# Patient Record
Sex: Male | Born: 1960 | Race: Black or African American | Hispanic: No | Marital: Single | Smoking: Current every day smoker
Health system: Southern US, Community
[De-identification: ages and names within clinical notes are randomized; demographics above are authoritative.]

## PROBLEM LIST (undated history)

## (undated) DIAGNOSIS — F32A Depression, unspecified: Secondary | ICD-10-CM

## (undated) DIAGNOSIS — I10 Essential (primary) hypertension: Secondary | ICD-10-CM

## (undated) DIAGNOSIS — F419 Anxiety disorder, unspecified: Secondary | ICD-10-CM

## (undated) DIAGNOSIS — T7840XA Allergy, unspecified, initial encounter: Secondary | ICD-10-CM

## (undated) HISTORY — DX: Allergy, unspecified, initial encounter: T78.40XA

## (undated) HISTORY — DX: Depression, unspecified: F32.A

## (undated) HISTORY — DX: Essential (primary) hypertension: I10

## (undated) HISTORY — PX: ANKLE FRACTURE SURGERY: SHX122

## (undated) HISTORY — DX: Anxiety disorder, unspecified: F41.9

---

## 2000-11-08 ENCOUNTER — Ambulatory Visit (HOSPITAL_COMMUNITY): Admission: RE | Admit: 2000-11-08 | Discharge: 2000-11-08 | Payer: Self-pay | Admitting: Family Medicine

## 2000-11-08 ENCOUNTER — Encounter: Payer: Self-pay | Admitting: Family Medicine

## 2005-02-25 ENCOUNTER — Emergency Department (HOSPITAL_COMMUNITY): Admission: EM | Admit: 2005-02-25 | Discharge: 2005-02-25 | Payer: Self-pay | Admitting: *Deleted

## 2008-01-02 ENCOUNTER — Emergency Department (HOSPITAL_COMMUNITY): Admission: EM | Admit: 2008-01-02 | Discharge: 2008-01-03 | Payer: Self-pay | Admitting: Emergency Medicine

## 2010-05-07 ENCOUNTER — Emergency Department (HOSPITAL_COMMUNITY): Admission: EM | Admit: 2010-05-07 | Discharge: 2010-05-07 | Payer: Self-pay | Admitting: Family Medicine

## 2016-09-29 ENCOUNTER — Ambulatory Visit (INDEPENDENT_AMBULATORY_CARE_PROVIDER_SITE_OTHER): Payer: BC Managed Care – PPO

## 2016-09-29 ENCOUNTER — Other Ambulatory Visit: Payer: Self-pay

## 2016-09-29 ENCOUNTER — Ambulatory Visit (INDEPENDENT_AMBULATORY_CARE_PROVIDER_SITE_OTHER): Payer: BC Managed Care – PPO | Admitting: Emergency Medicine

## 2016-09-29 VITALS — BP 130/90 | HR 65 | Temp 97.8°F | Resp 18 | Ht 67.5 in | Wt 187.8 lb

## 2016-09-29 DIAGNOSIS — R071 Chest pain on breathing: Secondary | ICD-10-CM

## 2016-09-29 LAB — POCT CBC
Granulocyte percent: 43.8 %G (ref 37–80)
HCT, POC: 42.4 % — AB (ref 43.5–53.7)
Hemoglobin: 15.4 g/dL (ref 14.1–18.1)
Lymph, poc: 3.3 (ref 0.6–3.4)
MCH, POC: 32.9 pg — AB (ref 27–31.2)
MCHC: 36.3 g/dL — AB (ref 31.8–35.4)
MCV: 90.6 fL (ref 80–97)
MID (cbc): 0.5 (ref 0–0.9)
MPV: 6.3 fL (ref 0–99.8)
POC Granulocyte: 3 (ref 2–6.9)
POC LYMPH PERCENT: 48.5 %L (ref 10–50)
POC MID %: 7.7 %M (ref 0–12)
Platelet Count, POC: 276 10*3/uL (ref 142–424)
RBC: 4.67 M/uL — AB (ref 4.69–6.13)
RDW, POC: 13.3 %
WBC: 6.8 10*3/uL (ref 4.6–10.2)

## 2016-09-29 LAB — D-DIMER, QUANTITATIVE (NOT AT ARMC): D-Dimer, Quant: 0.35 mcg/mL FEU (ref <0.50)

## 2016-09-29 LAB — TROPONIN I: Troponin I: <0.01 ng/mL (ref <=0.05)

## 2016-09-29 MED ORDER — CYCLOBENZAPRINE HCL 5 MG PO TABS: 5.0000 mg | ORAL_TABLET | Freq: Three times a day (TID) | ORAL | 1 refills | Status: AC | PRN

## 2016-09-29 NOTE — Progress Notes (Signed)
Patient ID: Clarence Smith, male   DOB: Sep 13, 1961, 55 y.o.   MRN: [id number]    By signing my name below, I, Essence Howell, attest that this documentation has been prepared under the direction and in the presence of Darlyne Russian, MD Electronically Signed: Ladene Artist, ED Scribe 09/29/2016 at 12:43 PM.  Chief Complaint:  Chief Complaint  Patient presents with  . Chest Pain    upper chest pain - more severe in the last 3 days  . Shortness of Breath    HPI: Clarence Smith is a 55 y.o. male who reports to [facility] today complaining of intermittent central chest pain, constant right upper chest pain over the past 4 days. Pt describes pain as gradually worsening, dull pain that progresses as the day goes on, with exertion, palpation and with laughing. Pt states "it feels as if something is pushing outwards in my chest". Pt works as a Librarian, academic at [employer] and reports some lifting but denies heavy lifting or known injury. He reports associated symptoms of sob that is exacerbated with simple acts such as taking his clothes off, numbness in right arm, right neck stiffness and a globus sensation. He also reports mild right calf pain but denies calf swelling. Pt denies nausea and diaphoresis. He is a current smoker; reports that it takes him 2.5 weeks to complete 1 pack of cigarettes. He reports a strong family h/o DM, aunt who passed from MI, sister with a h/o leukemia that is in remission. Pt denies a personal h/o heart or lung disease. Pt denies recent travel.   No past medical history on file. No past surgical history on file. Social History   Social History  . Marital status: Single    Spouse name: N/A  . Number of children: N/A  . Years of education: N/A   Social History Main Topics  . Smoking status: Current Every Day Smoker  . Smokeless tobacco: None  . Alcohol use None  . Drug use: Unknown  . Sexual activity: Not Asked   Other Topics Concern  . None   Social History Narrative  . None   Family  History  Problem Relation Age of Onset  . Cancer Mother   . Hypertension Mother   . Diabetes Father   . Alcohol abuse Father    No Known Allergies Prior to Admission medications   Medication Sig Start Date End Date Taking? Authorizing Provider  aspirin EC 81 MG tablet Take 81 mg by mouth daily.   Yes Historical Provider, MD   ROS: The patient denies fevers, chills, night sweats, unintentional weight loss, palpitations, wheezing, nausea, vomiting, abdominal pain, dysuria, hematuria, melena, weakness, or tingling. +chest pain, +sob, +neck stiffness, +numbness  All other systems have been reviewed and were otherwise negative with the exception of those mentioned in the HPI and as above.    PHYSICAL EXAM: Vitals:   09/29/16 1235  BP: 130/90  Pulse: 65  Resp: 18  Temp: 97.8 F (36.6 C)   Body mass index is 28.98 kg/m.   General: Alert, no acute distress HEENT:  Normocephalic, atraumatic, oropharynx patent. Eye: [facility] Cardiovascular:  Regular rate and rhythm, no rubs murmurs or gallops.  No Carotid bruits, radial pulse intact. No pedal edema.  Respiratory: Clear to auscultation bilaterally.  No wheezes, rales, or rhonchi.  No cyanosis, no use of accessory musculature. Minimal diminished breath sounds on the R.  Abdominal: No organomegaly, abdomen is soft and non-tender, positive bowel sounds.  No masses. Musculoskeletal:  Gait intact. No edema, tenderness. Complaining of R-sided chest fullness. No swelling or calf tendernessHe does have significant tenderness over the right periscapular area.. Skin: No rashes on R upper chest.  Neurologic: Facial musculature symmetric. Psychiatric: Patient acts appropriately throughout our interaction. Lymphatic: No cervical or submandibular lymphadenopathy  LABS: Results for orders placed or performed in visit on 09/29/16  POCT CBC  Result Value Ref Range   WBC 6.8 4.6 - 10.2 K/uL   Lymph, poc 3.3 0.6 - 3.4   POC LYMPH PERCENT 48.5 10  - 50 %L   MID (cbc) 0.5 0 - 0.9   POC MID % 7.7 0 - 12 %M   POC Granulocyte 3.0 2 - 6.9   Granulocyte percent 43.8 37 - 80 %G   RBC 4.67 (A) 4.69 - 6.13 M/uL   Hemoglobin 15.4 14.1 - 18.1 g/dL   HCT, POC 42.4 (A) 43.5 - 53.7 %   MCV 90.6 80 - 97 fL   MCH, POC 32.9 (A) 27 - 31.2 pg   MCHC 36.3 (A) 31.8 - 35.4 g/dL   RDW, POC 13.3 %   Platelet Count, POC 276 142 - 424 K/uL   MPV 6.3 0 - 99.8 fL   EKG/XRAY:   Primary read interpreted by Dr. Everlene Farrier at [facility]. EKG shows left axis deviation left anterior hemiblock no acute changes. T-wave inverted in lead 3 Dg Chest 2 View  Result Date: 09/29/2016 CLINICAL DATA:  55 year old with chest pain. EXAM: CHEST  2 VIEW COMPARISON:  None. FINDINGS: Heart and mediastinum are within normal limits. Both lungs are clear without airspace disease or pulmonary edema. No acute bone abnormality. No pleural effusions. Negative for a pneumothorax. IMPRESSION: No active cardiopulmonary disease. Electronically Signed   By: Markus Daft M.D.   On: 09/29/2016 13:28   ASSESSMENT/PLAN: Patient presents with progressive discomfort right upper anterior chest and also tenderness in the right posterior cervical periscapular area. Will treat with a muscle relaxant and Tylenol for now. He can apply heat or ice to the area. I have taken the patient out of work a couple of days. Recheck in 72 hours if not improved. A stat troponin and d-dimer were done.I personally performed the services described in this documentation, which was scribed in my presence. The recorded information has been reviewed and is accurate. Pulse ox stayed 96-98 with ambulation.    Gross sideeffects, risk and benefits, and alternatives of medications d/w patient. Patient is aware that all medications have potential sideeffects and we are unable to predict every sideeffect or drug-drug interaction that may occur.  Arlyss Queen MD 09/29/2016 12:43 PM

## 2016-09-29 NOTE — Patient Instructions (Addendum)
Take 2 Tylenol every 8 hours as needed for pain. Take your muscle relaxant every 6-8 hours. Try and apply heat or ice to the area of discomfort.    IF you received an x-ray today, you will receive an invoice from [facility]. Please contact [facility] at [phone] with questions or concerns regarding your invoice.   IF you received labwork today, you will receive an invoice from [employer]. Please contact [facility] at [phone] with questions or concerns regarding your invoice.   Our billing staff will not be able to assist you with questions regarding bills from these companies.  You will be contacted with the lab results as soon as they are available. The fastest way to get your results is to activate your My Chart account. Instructions are located on the last page of this paperwork. If you have not heard from [place] regarding the results in 2 weeks, please contact this office.    Chest Wall Pain Chest wall pain is pain in or around the bones and muscles of your chest. Sometimes, an injury causes this pain. Sometimes, the cause may not be known. This pain may take several weeks or longer to get better. HOME CARE INSTRUCTIONS  Pay attention to any changes in your symptoms. Take these actions to help with your pain:   Rest as told by your health care provider.   Avoid activities that cause pain. These include any activities that use your chest muscles or your abdominal and side muscles to lift heavy items.   If directed, apply ice to the painful area:  Put ice in a plastic bag.  Place a towel between your skin and the bag.  Leave the ice on for 20 minutes, 2-3 times per day.  Take over-the-counter and prescription medicines only as told by your health care provider.  Do not use tobacco products, including cigarettes, chewing tobacco, and e-cigarettes. If you need help quitting, ask your health care provider.  Keep all follow-up  visits as told by your health care provider. This is important. SEEK MEDICAL CARE IF:  You have a fever.  Your chest pain becomes worse.  You have new symptoms. SEEK IMMEDIATE MEDICAL CARE IF:  You have nausea or vomiting.  You feel sweaty or light-headed.  You have a cough with phlegm (sputum) or you cough up blood.  You develop shortness of breath.   This information is not intended to replace advice given to you by your health care provider. Make sure you discuss any questions you have with your health care provider.   Document Released: 11/08/2005 Document Revised: 07/30/2015 Document Reviewed: 02/03/2015 Elsevier Interactive Patient Education [employer].

## 2016-09-29 NOTE — Progress Notes (Addendum)
[facility] CALLED D DIMER 0.35 MICHAEL CLARK PA ADVISED AND PATIENT ADVISED NEGATIVE/NO BLOOD CLOT

## 2017-11-04 IMAGING — DX DG CHEST 2V
2 series · 2 of 2 positions shown · non-contrast
Comparison: None.

CLINICAL DATA: 55-year-old with chest pain.

EXAM:
CHEST  2 VIEW

[chest pa]
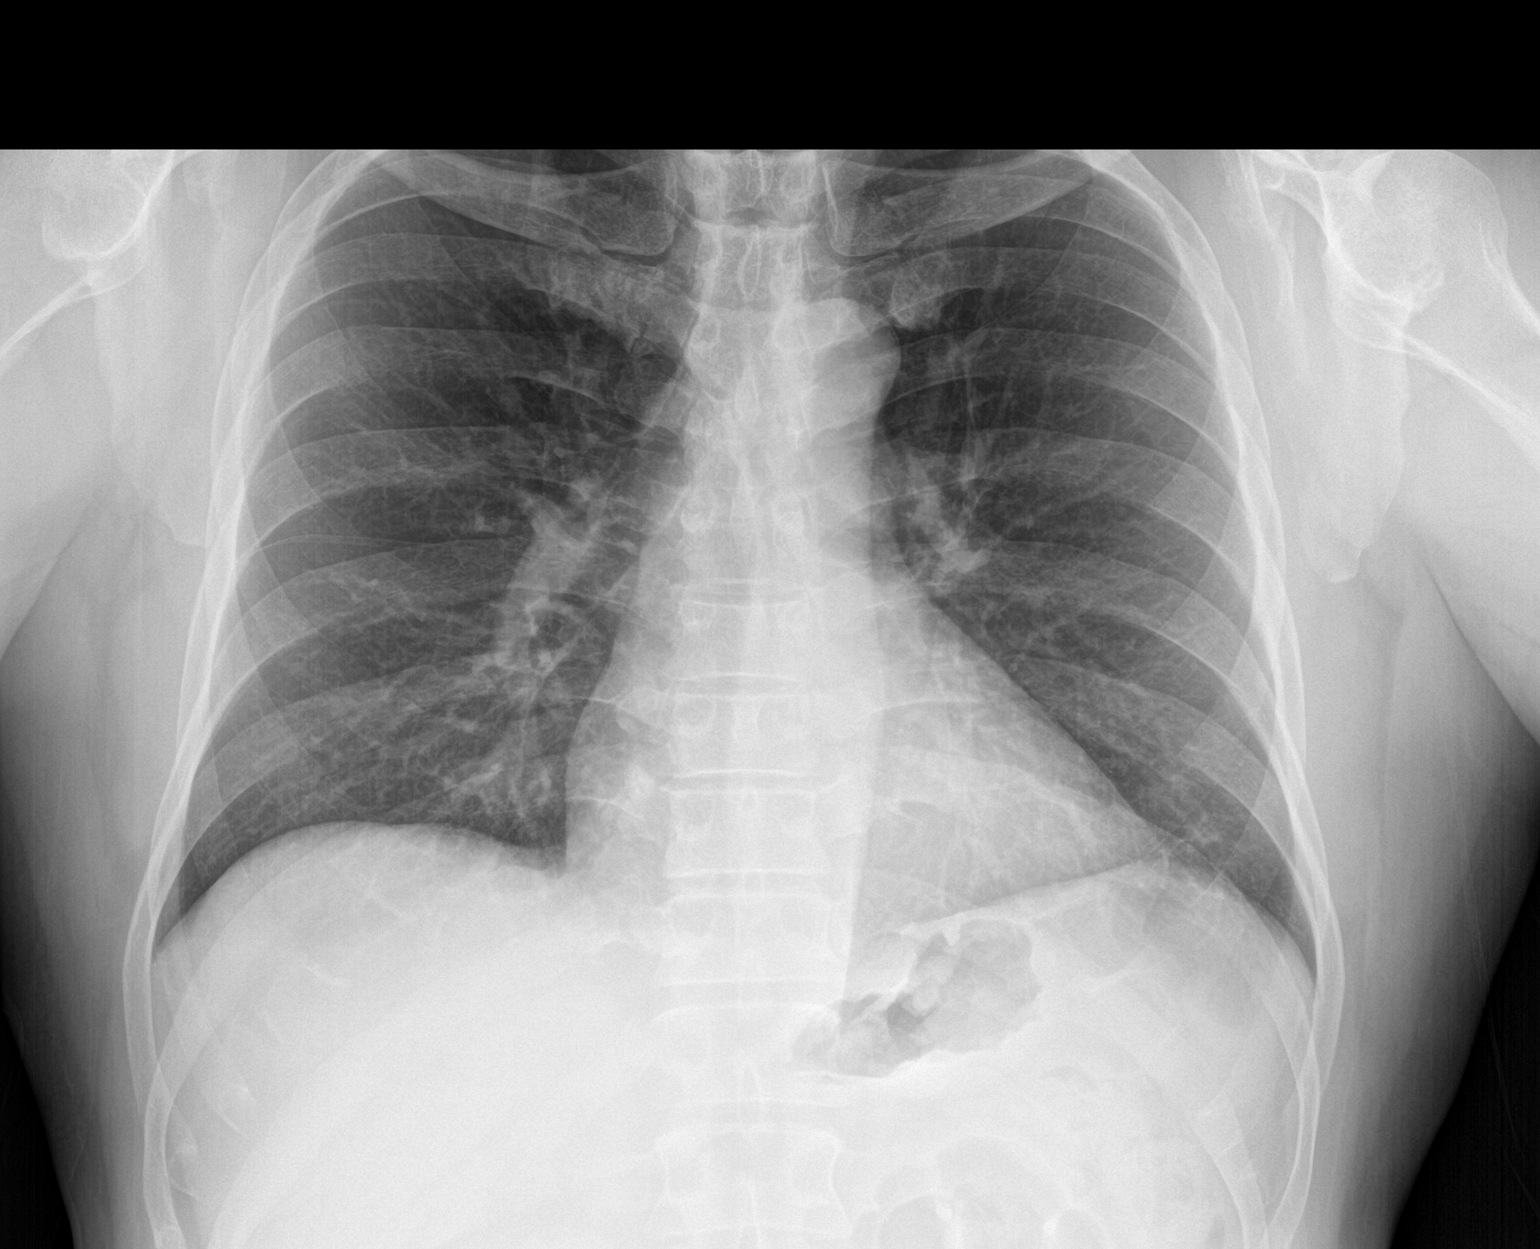

[chest lat]
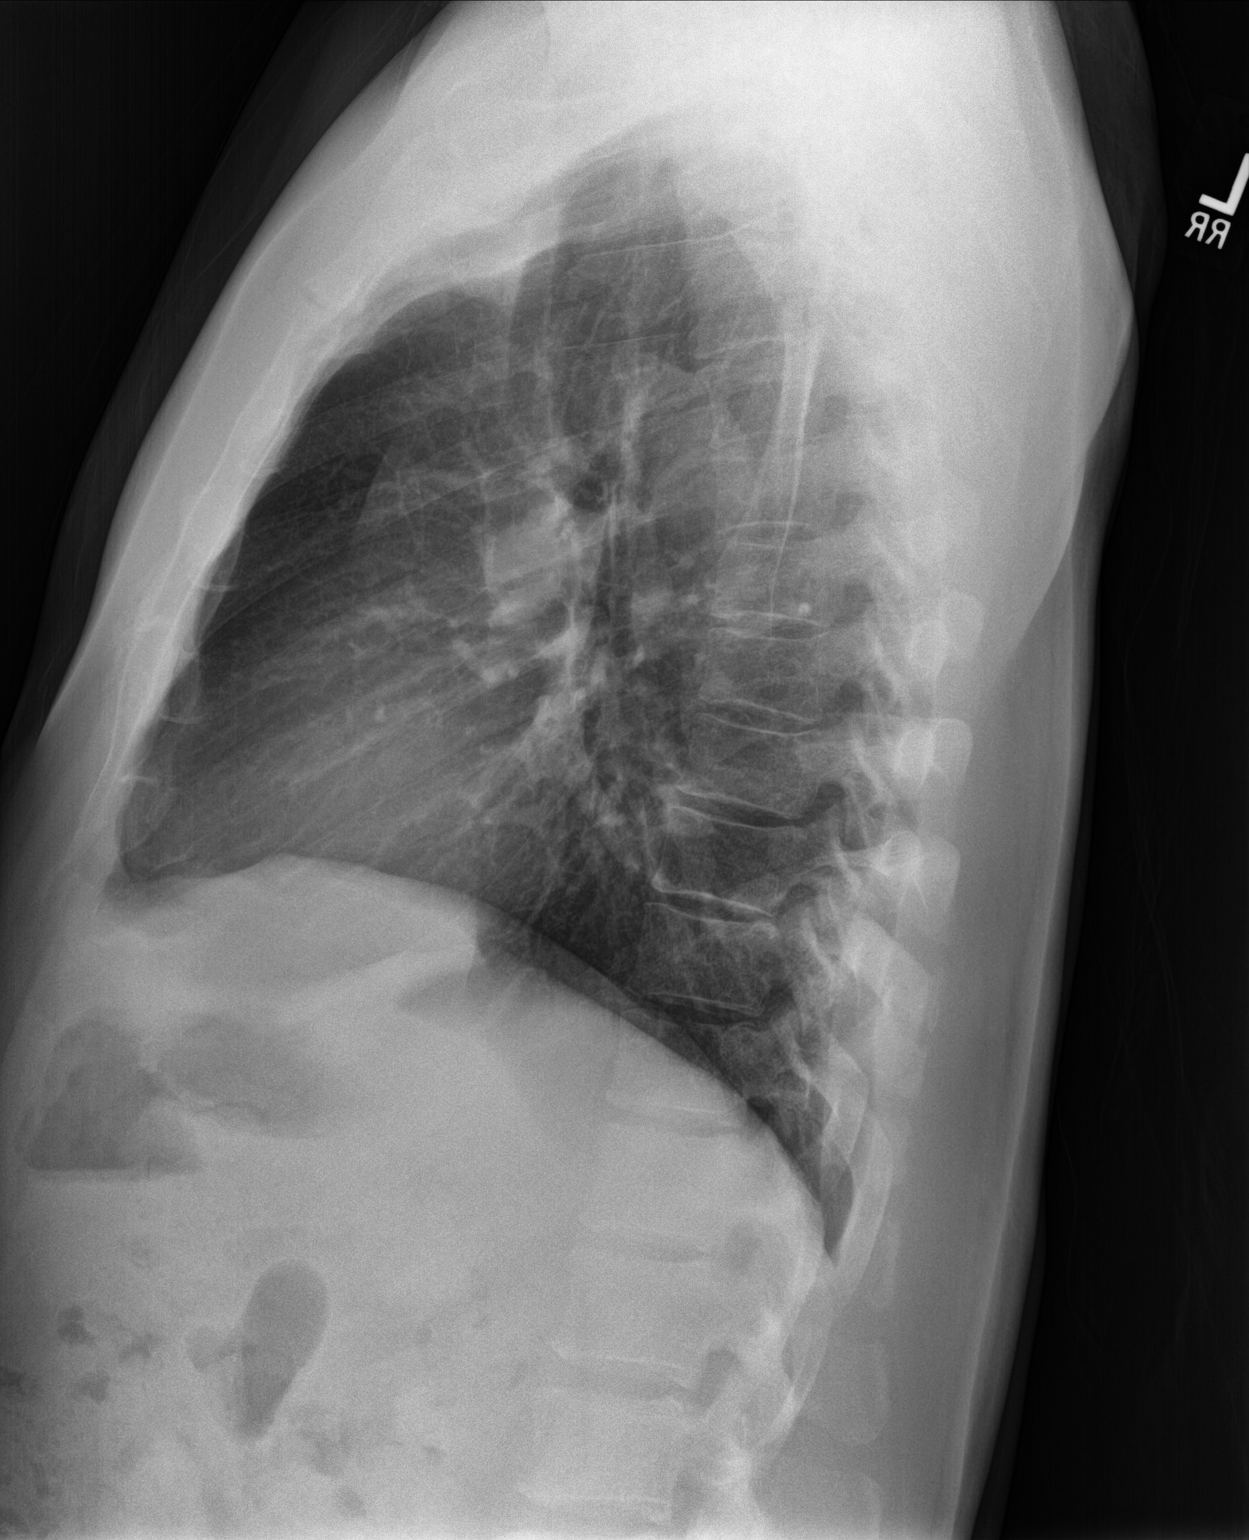

[2 of 2 positions shown; findings below may reference images not displayed]

FINDINGS: Heart and mediastinum are within normal limits. Both lungs are clear
without airspace disease or pulmonary edema. No acute bone
abnormality. No pleural effusions. Negative for a pneumothorax.
IMPRESSION: No active cardiopulmonary disease.

## 2019-07-16 ENCOUNTER — Other Ambulatory Visit: Payer: Self-pay

## 2019-07-16 DIAGNOSIS — Z20822 Contact with and (suspected) exposure to covid-19: Secondary | ICD-10-CM

## 2019-07-17 LAB — NOVEL CORONAVIRUS, NAA: SARS-CoV-2, NAA: NOT DETECTED

## 2019-11-21 ENCOUNTER — Ambulatory Visit: Payer: BC Managed Care – PPO | Attending: Internal Medicine

## 2019-11-21 DIAGNOSIS — Z20822 Contact with and (suspected) exposure to covid-19: Secondary | ICD-10-CM

## 2019-11-22 LAB — NOVEL CORONAVIRUS, NAA: SARS-CoV-2, NAA: NOT DETECTED

## 2020-12-11 ENCOUNTER — Other Ambulatory Visit: Payer: Self-pay

## 2020-12-11 DIAGNOSIS — Z20822 Contact with and (suspected) exposure to covid-19: Secondary | ICD-10-CM

## 2020-12-13 LAB — NOVEL CORONAVIRUS, NAA: SARS-CoV-2, NAA: DETECTED — AB

## 2020-12-13 LAB — SARS-COV-2, NAA 2 DAY TAT

## 2020-12-30 ENCOUNTER — Ambulatory Visit (AMBULATORY_SURGERY_CENTER): Payer: Self-pay

## 2020-12-30 ENCOUNTER — Other Ambulatory Visit: Payer: Self-pay

## 2020-12-30 VITALS — Ht 67.5 in | Wt 196.0 lb

## 2020-12-30 DIAGNOSIS — Z1211 Encounter for screening for malignant neoplasm of colon: Secondary | ICD-10-CM

## 2020-12-30 MED ORDER — SUTAB 1479-225-188 MG PO TABS: 1.0000 | ORAL_TABLET | ORAL | 0 refills | Status: DC

## 2020-12-30 NOTE — Progress Notes (Signed)
No egg or soy allergy known to patient  No issues with past sedation with any surgeries or procedures No intubation problems in the past  No FH of Malignant Hyperthermia No diet pills per patient No home 02 use per patient  No blood thinners per patient  Pt denies issues with constipation  No A fib or A flutter  EMMI video via MyChart  COVID 19 guidelines implemented in [facility] today with Pt and RN  Pt is fully vaccinated for Covid x 2 + booster= Coupon given to pt in [facility] today , Code to Pharmacy and  NO PA's for preps discussed with pt in [facility] today  Due to the COVID-19 pandemic we are asking patients to follow certain guidelines.  Pt aware of COVID protocols and [facility] guidelines

## 2021-01-09 ENCOUNTER — Encounter: Payer: Self-pay | Admitting: Internal Medicine

## 2021-01-13 ENCOUNTER — Other Ambulatory Visit: Payer: Self-pay

## 2021-01-13 ENCOUNTER — Ambulatory Visit (AMBULATORY_SURGERY_CENTER): Payer: BC Managed Care – PPO | Admitting: Internal Medicine

## 2021-01-13 ENCOUNTER — Encounter: Payer: Self-pay | Admitting: Internal Medicine

## 2021-01-13 VITALS — BP 122/86 | HR 70 | Temp 97.1°F | Resp 18 | Ht 67.5 in | Wt 196.0 lb

## 2021-01-13 DIAGNOSIS — D123 Benign neoplasm of transverse colon: Secondary | ICD-10-CM

## 2021-01-13 DIAGNOSIS — K635 Polyp of colon: Secondary | ICD-10-CM

## 2021-01-13 DIAGNOSIS — Z800 Family history of malignant neoplasm of digestive organs: Secondary | ICD-10-CM | POA: Diagnosis not present

## 2021-01-13 DIAGNOSIS — Z1211 Encounter for screening for malignant neoplasm of colon: Secondary | ICD-10-CM

## 2021-01-13 MED ORDER — SODIUM CHLORIDE 0.9 % IV SOLN: 500.0000 mL | Freq: Once | INTRAVENOUS | Status: AC

## 2021-01-13 NOTE — Progress Notes (Signed)
Vitals-SDH  Pt's states no medical or surgical changes since previsit or office visit.  Patient has two loose teeth in front! CRNA aware

## 2021-01-13 NOTE — Op Note (Signed)
[facility] Patient Name: Clarence Smith Procedure Date: 01/13/2021 11:05 AM MRN: [id number] Endoscopist: Jerene Bears , MD Age: 60 Referring MD:  Date of Birth: March 11, 1961 Gender: Male Account #: [id number] Procedure:                Colonoscopy Indications:              Screening in patient at increased risk: Family                            history of 1st-degree relative (mother) with                            colorectal cancer, This is the patient's first                            colonoscopy Medicines:                Monitored Anesthesia Care Procedure:                Pre-Anesthesia Assessment:                           - Prior to the procedure, a History and Physical                            was performed, and patient medications and                            allergies were reviewed. The patient's tolerance of                            previous anesthesia was also reviewed. The risks                            and benefits of the procedure and the sedation                            options and risks were discussed with the patient.                            All questions were answered, and informed consent                            was obtained. Prior Anticoagulants: The patient has                            taken no previous anticoagulant or antiplatelet                            agents. ASA Grade Assessment: II - A patient with                            mild systemic disease. After reviewing the risks  and benefits, the patient was deemed in                            satisfactory condition to undergo the procedure.                           After obtaining informed consent, the colonoscope                            was passed under direct vision. Throughout the                            procedure, the patient's blood pressure, pulse, and                            oxygen saturations were monitored continuously. The                             Colonoscope was introduced through the anus and                            advanced to the cecum, identified by appendiceal                            orifice and ileocecal valve. The colonoscopy was                            performed without difficulty. The patient tolerated                            the procedure well. The quality of the bowel                            preparation was good. The ileocecal valve,                            appendiceal orifice, and rectum were photographed. Scope In: 11:28:52 AM Scope Out: 11:42:57 AM Scope Withdrawal Time: 0 hours 11 minutes 57 seconds  Total Procedure Duration: 0 hours 14 minutes 5 seconds  Findings:                 The digital rectal exam was normal.                           A 8 mm polyp was found in the hepatic flexure. The                            polyp was sessile. The polyp was removed with a                            cold snare. Resection and retrieval were complete.                           A 4 mm polyp was found  in the transverse colon. The                            polyp was sessile. The polyp was removed with a                            cold snare. Resection and retrieval were complete.                           Multiple small and large-mouthed diverticula were                            found in the descending colon and splenic flexure.                           The retroflexed view of the distal rectum and anal                            verge was normal and showed no anal or rectal                            abnormalities. Complications:            No immediate complications. Estimated Blood Loss:     Estimated blood loss was minimal. Impression:               - One 8 mm polyp at the hepatic flexure, removed                            with a cold snare. Resected and retrieved.                           - One 4 mm polyp in the transverse colon, removed                            with a cold snare.  Resected and retrieved.                           - Diverticulosis in the descending colon and at the                            splenic flexure. Recommendation:           - Patient has a contact number available for                            emergencies. The signs and symptoms of potential                            delayed complications were discussed with the                            patient. Return to normal activities tomorrow.  Written discharge instructions were provided to the                            patient.                           - Resume previous diet.                           - Continue present medications.                           - Await pathology results.                           - Repeat colonoscopy is recommended. The                            colonoscopy date will be determined after pathology                            results from today's exam become available for                            review. Jerene Bears, MD 01/13/2021 11:56:38 AM This report has been signed electronically.

## 2021-01-13 NOTE — Discharge Instructions (Signed)
Resume previous medications. Handouts on findings given to patient. Await pathology for final recommendations.  YOU HAD AN ENDOSCOPIC PROCEDURE TODAY AT THE [facility]:   Refer to the procedure report that was given to you for any specific questions about what was found during the examination.  If the procedure report does not answer your questions, please call your gastroenterologist to clarify.  If you requested that your care partner not be given the details of your procedure findings, then the procedure report has been included in a sealed envelope for you to review at your convenience later.  YOU SHOULD EXPECT: Some feelings of bloating in the abdomen. Passage of more gas than usual.  Walking can help get rid of the air that was put into your GI tract during the procedure and reduce the bloating. If you had a lower endoscopy (such as a colonoscopy or flexible sigmoidoscopy) you may notice spotting of blood in your stool or on the toilet paper. If you underwent a bowel prep for your procedure, you may not have a normal bowel movement for a few days.  Please Note:  You might notice some irritation and congestion in your nose or some drainage.  This is from the oxygen used during your procedure.  There is no need for concern and it should clear up in a day or so.  SYMPTOMS TO REPORT IMMEDIATELY:   Following lower endoscopy (colonoscopy or flexible sigmoidoscopy):  Excessive amounts of blood in the stool  Significant tenderness or worsening of abdominal pains  Swelling of the abdomen that is new, acute  Fever of 100F or higher  For urgent or emergent issues, a gastroenterologist can be reached at any hour by calling [phone]. Do not use MyChart messaging for urgent concerns.    DIET:  We do recommend a small meal at first, but then you may proceed to your regular diet.  Drink plenty of fluids but you should avoid alcoholic beverages for 24 hours.  ACTIVITY:  You should  plan to take it easy for the rest of today and you should NOT DRIVE or use heavy machinery until tomorrow (because of the sedation medicines used during the test).    FOLLOW UP: Our staff will call the number listed on your records 48-72 hours following your procedure to check on you and address any questions or concerns that you may have regarding the information given to you following your procedure. If we do not reach you, we will leave a message.  We will attempt to reach you two times.  During this call, we will ask if you have developed any symptoms of COVID 19. If you develop any symptoms (ie: fever, flu-like symptoms, shortness of breath, cough etc.) before then, please call [phone].  If you test positive for Covid 19 in the 2 weeks post procedure, please call and report this information to us.    If any biopsies were taken you will be contacted by phone or by letter within the next 1-3 weeks.  Please call us at [phone] if you have not heard about the biopsies in 3 weeks.    SIGNATURES/CONFIDENTIALITY: You and/or your care partner have signed paperwork which will be entered into your electronic medical record.  These signatures attest to the fact that that the information above on your After Visit Summary has been reviewed and is understood.  Full responsibility of the confidentiality of this discharge information lies with you and/or your care-partner. 

## 2021-01-13 NOTE — Progress Notes (Signed)
Called to room to assist during endoscopic procedure.  Patient ID and intended procedure confirmed with present staff. Received instructions for my participation in the procedure from the performing physician.  

## 2021-01-13 NOTE — Progress Notes (Signed)
Patient ID: Clarence Smith, male   DOB: 01-Jun-1961, 60 y.o.   MRN: [id number] Pt tolerated procedure well. Vitals stable. AVS provided

## 2021-01-15 ENCOUNTER — Telehealth: Payer: Self-pay

## 2021-01-15 NOTE — Telephone Encounter (Signed)
  Follow up Call-  Call back number 01/13/2021  Post procedure Call Back phone  # [phone]  Permission to leave phone message Yes  Some recent data might be hidden     Patient questions:  Do you have a fever, pain , or abdominal swelling? No. Pain Score  0 *  Have you tolerated food without any problems? Yes.    Have you been able to return to your normal activities? Yes.    Do you have any questions about your discharge instructions: Diet   No. Medications  No. Follow up visit  No.  Do you have questions or concerns about your Care? No.  Actions: * If pain score is 4 or above: No action needed, pain <4.  1. Have you developed a fever since your procedure? No   2.   Have you had an respiratory symptoms (SOB or cough) since your procedure? No   3.   Have you tested positive for COVID 19 since your procedure? No   4.   Have you had any family members/close contacts diagnosed with the COVID 19 since your procedure?  No    If yes to any of these questions please route to Joylene John, RN and Joella Prince, RN

## 2021-01-21 ENCOUNTER — Encounter: Payer: Self-pay | Admitting: Internal Medicine

## 2022-02-02 ENCOUNTER — Other Ambulatory Visit: Payer: Self-pay | Admitting: Family Medicine

## 2022-02-02 DIAGNOSIS — E782 Mixed hyperlipidemia: Secondary | ICD-10-CM

## 2022-03-08 ENCOUNTER — Ambulatory Visit
Admission: RE | Admit: 2022-03-08 | Discharge: 2022-03-08 | Disposition: A | Discharge status: Home / Self Care | Payer: No Typology Code available for payment source | Source: Ambulatory Visit | Attending: Family Medicine | Admitting: Family Medicine

## 2022-03-08 DIAGNOSIS — E782 Mixed hyperlipidemia: Secondary | ICD-10-CM

## 2022-03-23 ENCOUNTER — Other Ambulatory Visit: Payer: Self-pay | Admitting: Family Medicine

## 2022-03-23 DIAGNOSIS — R9389 Abnormal findings on diagnostic imaging of other specified body structures: Secondary | ICD-10-CM

## 2022-03-24 ENCOUNTER — Ambulatory Visit
Admission: RE | Admit: 2022-03-24 | Discharge: 2022-03-24 | Disposition: A | Discharge status: Home / Self Care | Payer: BC Managed Care – PPO | Source: Ambulatory Visit | Attending: Family Medicine | Admitting: Family Medicine

## 2022-03-24 DIAGNOSIS — R9389 Abnormal findings on diagnostic imaging of other specified body structures: Secondary | ICD-10-CM

## 2022-03-24 MED ORDER — IOPAMIDOL (ISOVUE-300) INJECTION 61%
75.0000 mL | Freq: Once | INTRAVENOUS | Status: AC | PRN
  Administered 2022-03-24: 75 mL via INTRAVENOUS

## 2023-04-13 IMAGING — CT CT CARDIAC CORONARY ARTERY CALCIUM SCORE
3 series · 13 of 20 positions shown, 15 images · non-contrast
Comparison: None.

CLINICAL DATA: 61-year-old black male with mixed hyperlipidemia.

EXAM:
CT CARDIAC CORONARY ARTERY CALCIUM SCORE
TECHNIQUE: Non-contrast imaging through the heart was performed using
prospective ECG gating. Image post processing was performed on an
independent workstation, allowing for quantitative analysis of the
heart and coronary arteries. Note that this exam targets the heart
and the chest was not imaged in its entirety.

[Series 2: calcium scoring 2.00 qr36 bestdiast 70% hrt calciu · axial · 0.45mm/px · z∈[+1493,+1541]mm · 3 of 60 slices shown]
[im 12/60  vessel]
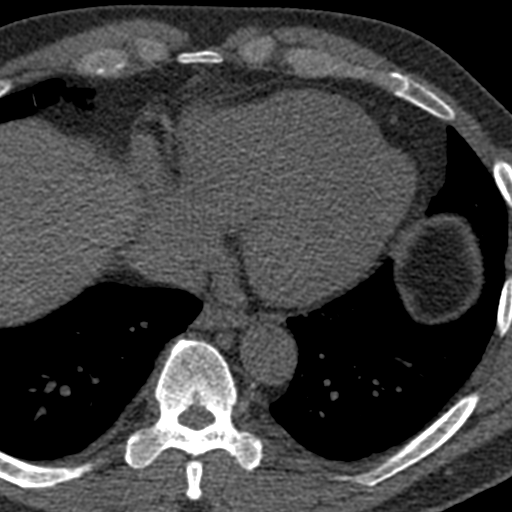
[im 24/60  vessel]
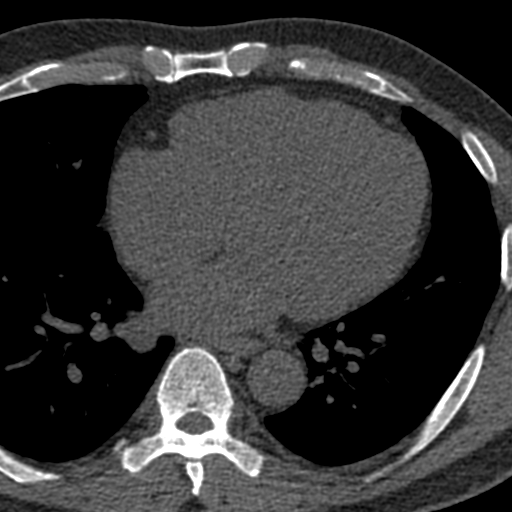
[im 36/60  vessel]
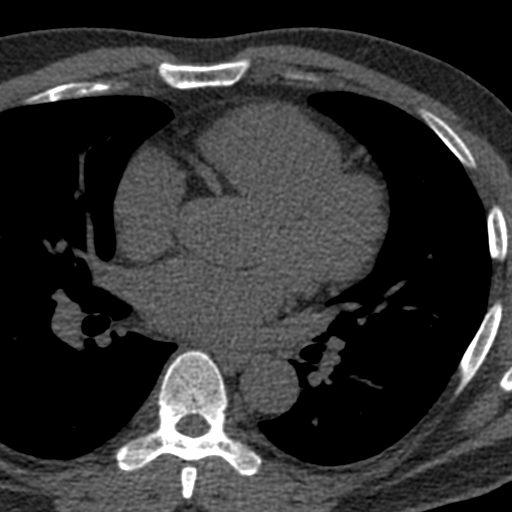

[Series 3: calcium scoring 2.00 br40 bestdiast 70% axial · axial · 0.56mm/px · z∈[+1489,+1569]mm · 5 of 60 slices shown, 7 images]
[im 10/60  vessel]
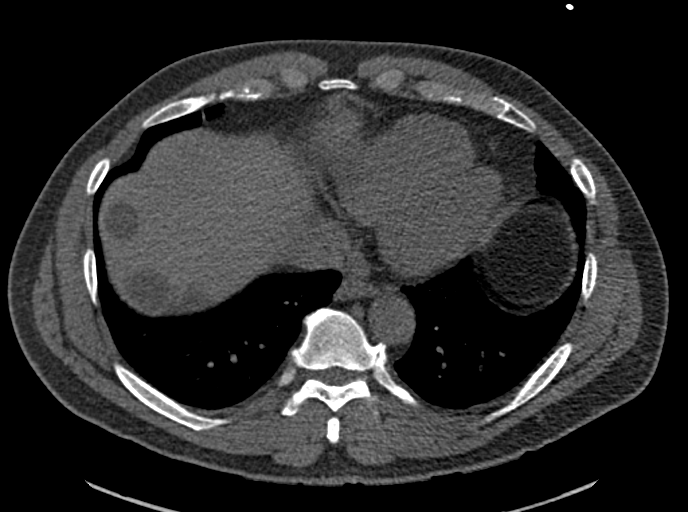
[im 10/60  lung]
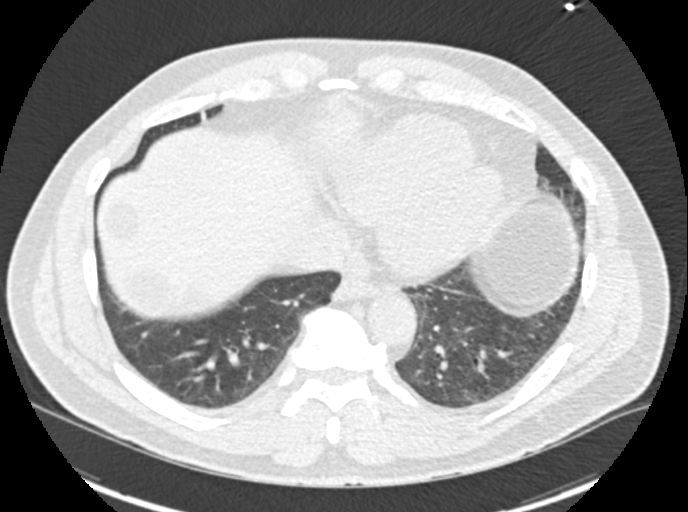
[im 20/60  vessel]
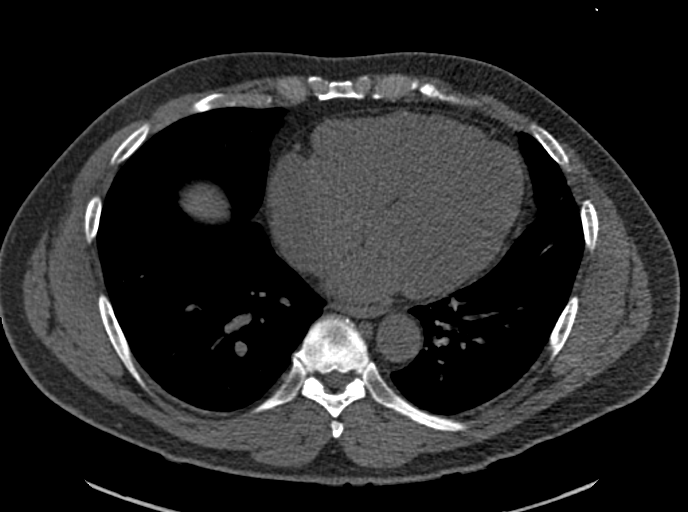
[im 30/60  vessel]
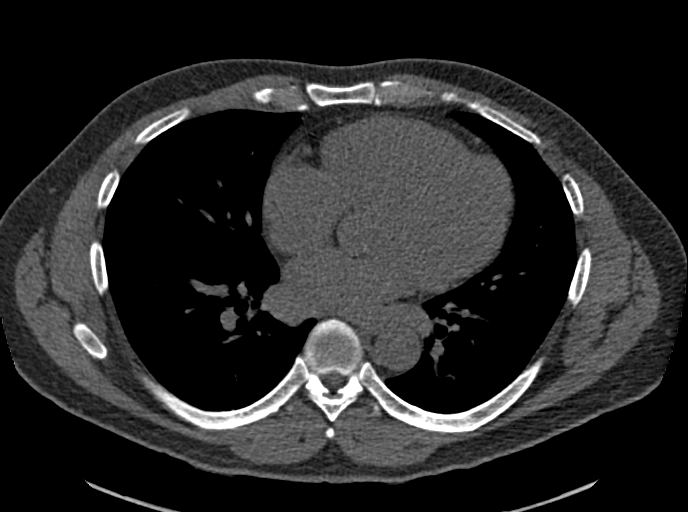
[im 40/60  vessel]
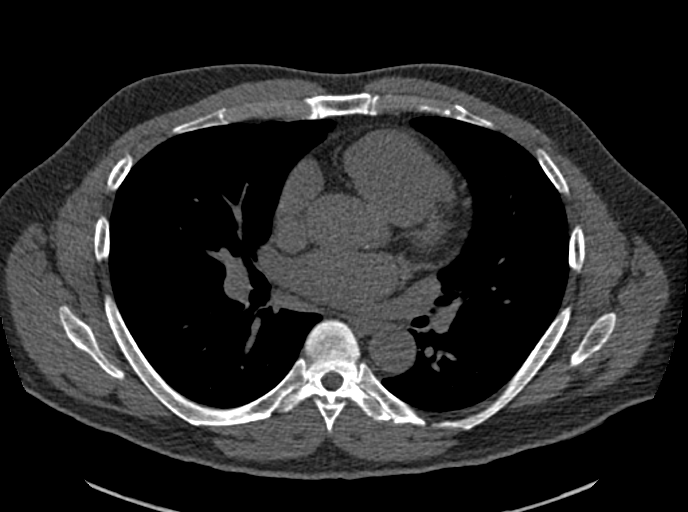
[im 50/60  vessel]
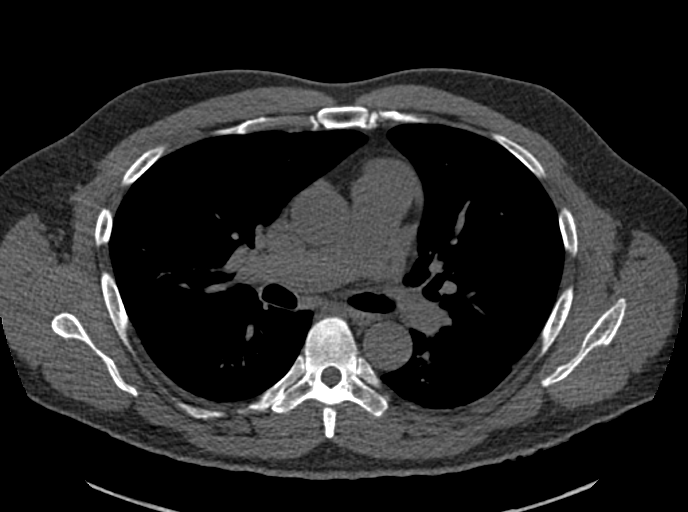
[im 50/60  lung]
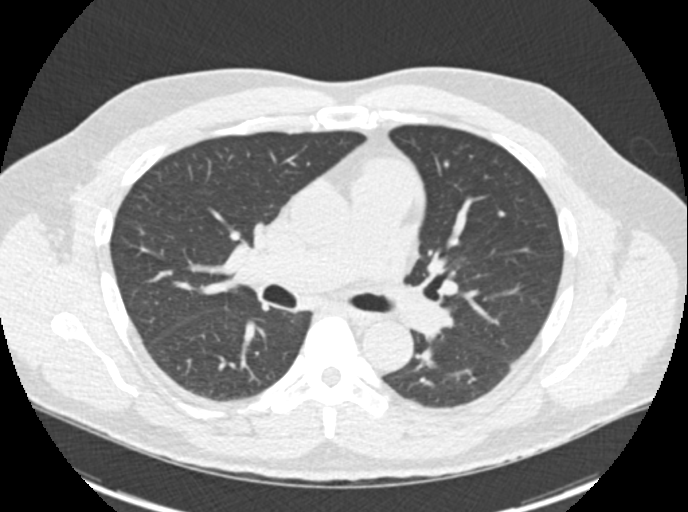

[Series 9: calcium scoring 2.00 br60 bestdiast 70% lungs · axial · 0.56mm/px · z∈[+1489,+1569]mm · 5 of 60 slices shown]
[im 10/60  vessel]
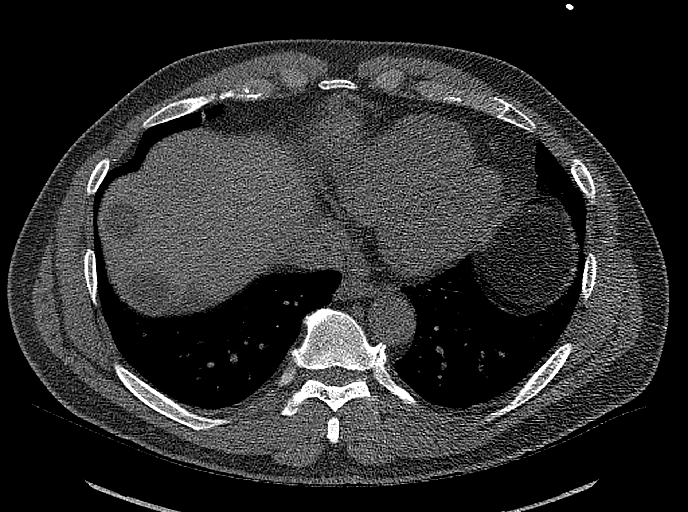
[im 20/60  vessel]
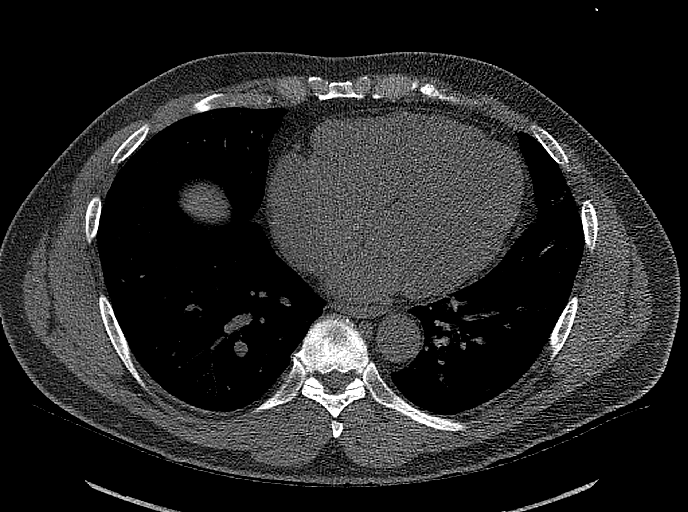
[im 30/60  vessel]
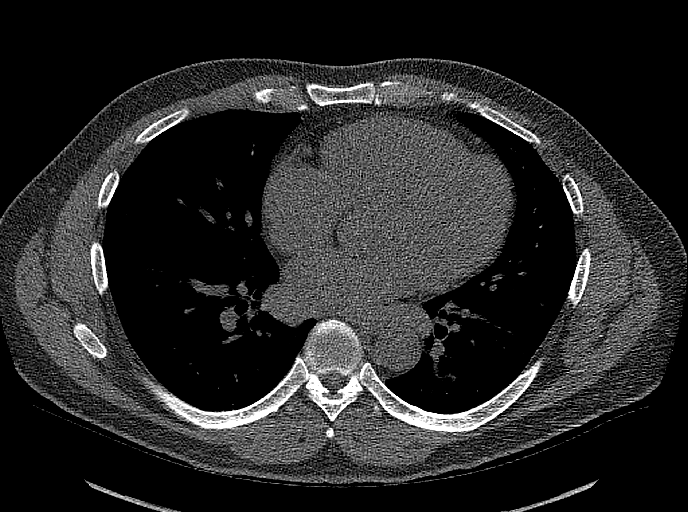
[im 40/60  vessel]
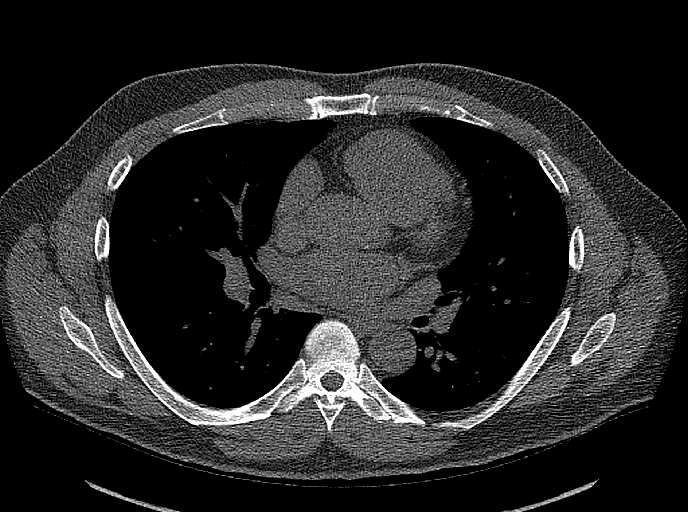
[im 50/60  vessel]
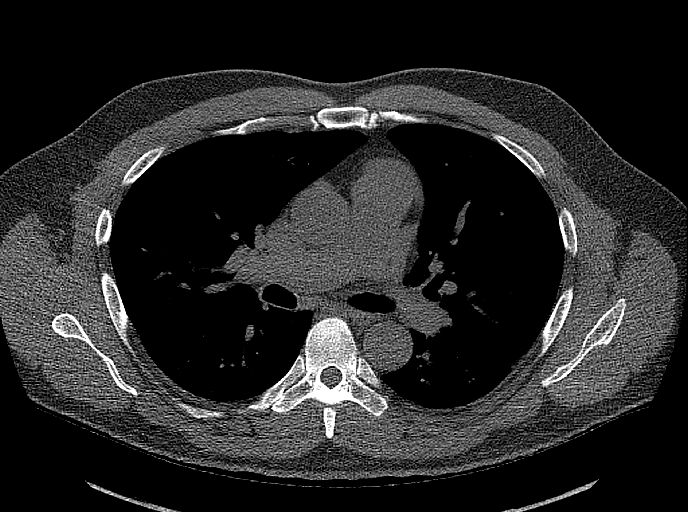

[13 of 20 positions shown; findings below may reference images not displayed]

FINDINGS: CORONARY CALCIUM SCORES:

Left Main: 0

LAD: 0

LCx: 0

RCA: 0

Total Agatston Score: 0

[HOSPITAL] percentile: 0

AORTA MEASUREMENTS:

Ascending Aorta: 32 mm

Descending Aorta: 29 mm

OTHER FINDINGS:

Heart size is normal. Soft tissue fullness along the posterior and
inferior aspect of the right inferior pulmonary vein is nonspecific
on sequence 3 image 38. No significant pericardial effusion.
Visualized mediastinal structures are normal. Evidence for multiple
low-density structures in the right hepatic that are incompletely
evaluated. Findings are suggestive for cysts and largest measures at
least 7.5 cm. Small patchy densities in the right middle lobe and
lingular are suggestive for atelectasis or scarring. No large areas
of airspace disease or consolidation in the visualized lungs. No
large pleural effusions. No acute bone abnormality. Small amount of
calcium at the aortic root.
IMPRESSION: 1. Coronary calcium score is 0.
2. Multiple hypodensities in the visualized liver. Largest measures
at least 7.5 cm. Findings are most likely related to hepatic cysts.
3. Fullness along the right inferior pulmonary vein is nonspecific
but poorly characterized on this exam without intravascular
contrast. Findings could be related to a bronchogenic cyst but
indeterminate. Consider further evaluation of this area with a post
contrast CT.

## 2023-05-04 ENCOUNTER — Encounter (HOSPITAL_BASED_OUTPATIENT_CLINIC_OR_DEPARTMENT_OTHER): Payer: Self-pay | Admitting: Emergency Medicine

## 2023-05-04 ENCOUNTER — Emergency Department (HOSPITAL_BASED_OUTPATIENT_CLINIC_OR_DEPARTMENT_OTHER)
Admission: EM | Admit: 2023-05-04 | Discharge: 2023-05-04 | Disposition: A | Discharge status: Home / Self Care | Payer: BC Managed Care – PPO | Attending: Emergency Medicine | Admitting: Emergency Medicine

## 2023-05-04 DIAGNOSIS — S0502XA Injury of conjunctiva and corneal abrasion without foreign body, left eye, initial encounter: Secondary | ICD-10-CM | POA: Insufficient documentation

## 2023-05-04 DIAGNOSIS — X58XXXA Exposure to other specified factors, initial encounter: Secondary | ICD-10-CM | POA: Diagnosis not present

## 2023-05-04 DIAGNOSIS — Y9389 Activity, other specified: Secondary | ICD-10-CM | POA: Insufficient documentation

## 2023-05-04 DIAGNOSIS — Z7982 Long term (current) use of aspirin: Secondary | ICD-10-CM | POA: Insufficient documentation

## 2023-05-04 DIAGNOSIS — H5789 Other specified disorders of eye and adnexa: Secondary | ICD-10-CM | POA: Diagnosis present

## 2023-05-04 MED ORDER — ERYTHROMYCIN 5 MG/GM OP OINT: TOPICAL_OINTMENT | OPHTHALMIC | 0 refills | Status: AC

## 2023-05-04 MED ORDER — TETRACAINE HCL 0.5 % OP SOLN
2.0000 [drp] | Freq: Once | OPHTHALMIC | Status: AC
  Administered 2023-05-04: 2 [drp] via OPHTHALMIC
  Filled 2023-05-04: qty 4

## 2023-05-04 MED ORDER — FLUORESCEIN SODIUM 1 MG OP STRP
1.0000 | ORAL_STRIP | Freq: Once | OPHTHALMIC | Status: AC
  Administered 2023-05-04: 1 via OPHTHALMIC
  Filled 2023-05-04: qty 1

## 2023-05-04 NOTE — ED Provider Notes (Signed)
[facility] EMERGENCY DEPARTMENT AT [facility] Provider Note   CSN: [id number] Arrival date & time: 05/04/23  1129     History  Chief Complaint  Patient presents with   Eye Problem    Clarence Smith is a 62 y.o. male.  With a history of anxiety, depression, seasonal allergies who presents to the ED for evaluation of left eye irritation.  He states approximately 4 hours prior to arrival he was cleaning some dusty boxes overhead when he felt some debris fly into his left eye.  He was not wearing any eye protection at that time.  He does not wear contacts.  He presented to the health center at the [facility] where he works and had his eye irrigated and states that the symptoms have improved since that time, however still has a foreign body sensation to the lateral portion of the left upper eyelid.  He denies any blurry vision or vision changes.  No history of glaucoma.  No discharge.  No pain with extraocular movement.   Eye Problem      Home Medications Prior to Admission medications   Medication Sig Start Date End Date Taking? Authorizing Provider  erythromycin ophthalmic ointment Place a 1/2 inch ribbon of ointment into the lower eyelid 4 times a day until your symptoms have resolved for at least 24 hours. 05/04/23  Yes Roux Brandy, Edsel Petrin, PA-C  aspirin EC 81 MG tablet Take 81 mg by mouth daily.    [provider]  cyclobenzaprine (FLEXERIL) 5 MG tablet Take 1 tablet (5 mg total) by mouth 3 (three) times daily as needed for muscle spasms. Patient not taking: No sig reported 09/29/16   Collene Gobble, MD  nicotine polacrilex (NICORETTE) 2 MG gum Weeks 1-6: chew 1 piece every 1-2 hours. Weeks 7-9: chew 1 piece every 2-4 hours. Weeks 10-12: chew 1 piece every 4-8 hours. 07/16/19   [provider]      Allergies    Patient has no known allergies.    Review of Systems   Review of Systems  Eyes:  Positive for pain.  All other systems reviewed and are  negative.   Physical Exam Updated Vital Signs BP 124/78   Pulse 69   Temp 97.7 F (36.5 C) (Oral)   SpO2 97%  Physical Exam Vitals and nursing note reviewed.  Constitutional:      General: He is not in acute distress.    Appearance: Normal appearance. He is normal weight. He is not ill-appearing.  HENT:     Head: Normocephalic and atraumatic.  Eyes:     General: Lids are normal. Lids are everted, no foreign bodies appreciated.     Intraocular pressure: Right eye pressure is 11 mmHg. Left eye pressure is 14 mmHg. Measurements were taken using a handheld tonometer.     Comments: Mild left conjunctival injection.  Lenses are clear bilaterally.  Upper and lower lid were everted with no foreign body appreciated.  Fluorescein staining reveals corneal abrasion to the left eye overlying the inferior portion of the iris.  Negative Seidel sign.  Intraocular pressure normal bilaterally.  Full EOM ROM bilaterally.  Pulmonary:     Effort: Pulmonary effort is normal. No respiratory distress.  Abdominal:     General: Abdomen is flat.  Musculoskeletal:        General: Normal range of motion.     Cervical back: Neck supple.  Skin:    General: Skin is warm and dry.  Neurological:  Mental Status: He is alert and oriented to person, place, and time.  Psychiatric:        Mood and Affect: Mood normal.        Behavior: Behavior normal.     ED Results / Procedures / Treatments   Labs (all labs ordered are listed, but only abnormal results are displayed) Labs Reviewed - No data to display  EKG None  Radiology No results found.  Procedures Procedures    Medications Ordered in ED Medications  fluorescein ophthalmic strip 1 strip (1 strip Left Eye Given 05/04/23 1223)  tetracaine (PONTOCAINE) 0.5 % ophthalmic solution 2 drop (2 drops Left Eye Given 05/04/23 1223)    ED Course/ Medical Decision Making/ A&P                             Medical Decision Making Risk Prescription  drug management.  This patient presents to the ED for concern of foreign body sensation of eye, this involves an extensive number of treatment options, and is a complaint that carries with it a high risk of complications and morbidity.  The emergent differential diagnosis for acute eye pain includes, but is not limited to ocular ischemia, optic neuritis, temporal arteritis, Sinusitis, neuralgia, Migraine, Acute angle closure glaucoma, eye trauma, uveitis, iritis, corneal abrasion/ulceration, and photokeratitis.   My initial workup includes fluorescein eye exam, visual acuity  Additional history obtained from: Nursing notes from this visit.  Afebrile, hemodynamically stable.  62 year old male presenting to the ED for evaluation of left eye irritation after he felt a foreign body fly into the eye.  He denies any vision changes.  He does have some continued irritation after irrigation.  He has no pain with EOMs.  Intraocular pressures are normal.  Fluorescein staining revealed a corneal abrasion of the left eye.  He does not wear contacts.  He will be treated with topical antibiotics.  He was encouraged to follow-up with his PCP in 1 week for reevaluation of his symptoms.  He was given return precautions.  Stable at discharge.  At this time there does not appear to be any evidence of an acute emergency medical condition and the patient appears stable for discharge with appropriate outpatient follow up. Diagnosis was discussed with patient who verbalizes understanding of care plan and is agreeable to discharge. I have discussed return precautions with patient who verbalizes understanding. Patient encouraged to follow-up with their PCP within 1 week. All questions answered.  Note: Portions of this report may have been transcribed using voice recognition software. Every effort was made to ensure accuracy; however, inadvertent computerized transcription errors may still be present.         Final  Clinical Impression(s) / ED Diagnoses Final diagnoses:  Abrasion of left cornea, initial encounter    Rx / DC Orders ED Discharge Orders          Ordered    erythromycin ophthalmic ointment        05/04/23 1255              Michelle Piper, PA-C 05/04/23 1310    Ernie Avena, MD 05/04/23 2021

## 2023-05-04 NOTE — Discharge Instructions (Addendum)
You have been seen today for your complaint of left eye pain. Your discharge medications include erythromycin.  This is a antibiotic ointment.  Apply a small amount to the left lower eyelid 4 times a day until your symptoms have resolved for at least 24 hours. Home care instructions are as follows:  You may use eyedrops and cool compresses to help with your symptoms Follow up with: Your primary care provider in 1 week for reevaluation of your symptoms Please seek immediate medical care if you develop any of the following symptoms: You have severe eye pain that does not get better with medicine. You have severe vision loss. At this time there does not appear to be the presence of an emergent medical condition, however there is always the potential for conditions to change. Please read and follow the below instructions.  Do not take your medicine if  develop an itchy rash, swelling in your mouth or lips, or difficulty breathing; call 911 and seek immediate emergency medical attention if this occurs.  You may review your lab tests and imaging results in their entirety on your MyChart account.  Please discuss all results of fully with your primary care provider and other specialist at your follow-up visit.  Note: Portions of this text may have been transcribed using voice recognition software. Every effort was made to ensure accuracy; however, inadvertent computerized transcription errors may still be present.

## 2023-05-04 NOTE — ED Triage Notes (Signed)
Pt here from work  with c/o left eye pain and redness after he thought something got  in it after picking up some rags

## 2023-10-11 ENCOUNTER — Other Ambulatory Visit: Payer: Self-pay | Admitting: Family Medicine

## 2023-10-11 DIAGNOSIS — J984 Other disorders of lung: Secondary | ICD-10-CM

## 2023-10-17 ENCOUNTER — Other Ambulatory Visit: Payer: BC Managed Care – PPO

## 2024-01-26 ENCOUNTER — Encounter: Payer: Self-pay | Admitting: Family Medicine

## 2024-01-27 ENCOUNTER — Other Ambulatory Visit: Payer: Self-pay | Admitting: Family Medicine

## 2024-01-27 DIAGNOSIS — J984 Other disorders of lung: Secondary | ICD-10-CM

## 2024-02-20 ENCOUNTER — Ambulatory Visit
Admission: RE | Admit: 2024-02-20 | Discharge: 2024-02-20 | Disposition: A | Discharge status: Home / Self Care | Payer: Self-pay | Source: Ambulatory Visit | Attending: Family Medicine | Admitting: Family Medicine

## 2024-02-20 DIAGNOSIS — J984 Other disorders of lung: Secondary | ICD-10-CM

## 2024-02-27 ENCOUNTER — Ambulatory Visit: Payer: Self-pay | Admitting: Surgery

## 2024-02-27 DIAGNOSIS — K429 Umbilical hernia without obstruction or gangrene: Secondary | ICD-10-CM | POA: Insufficient documentation

## 2024-02-27 NOTE — H&P (Signed)
 Subjective    Chief Complaint: New Consultation and Umbilical Hernia       History of Present Illness: Clarence Smith is a 63 y.o. male who is seen today as an office consultation at the request of Dr. Duanne Guess for evaluation of New Consultation and Umbilical Hernia .     [name] who presents with a history of a protruding umbilicus since he was a child.  This has become larger.  Over the last couple of years, he has noticed intermittent pain or a grabbing sensation at his umbilicus.  This is especially uncomfortable when he sneezes or coughs.  His job occasionally requires some heavy lifting.  He denies any GI obstructive symptoms.  No previous abdominal surgery.     Review of Systems: A complete review of systems was obtained from the patient.  I have reviewed this information and discussed as appropriate with the patient.  See HPI as well for other ROS.   Review of Systems  Constitutional: Negative.   HENT: Negative.    Eyes: Negative.   Respiratory: Negative.    Cardiovascular: Negative.   Gastrointestinal:  Positive for abdominal pain.  Genitourinary: Negative.   Musculoskeletal: Negative.   Skin: Negative.   Neurological: Negative.   Endo/Heme/Allergies: Negative.   Psychiatric/Behavioral: Negative.          Medical History: Past Medical History  History reviewed. No pertinent past medical history.     Problem List     Patient Active Problem List  Diagnosis   Umbilical hernia without obstruction or gangrene        Past Surgical History       Past Surgical History:  Procedure Laterality Date   ankle surgery            Allergies  No Known Allergies     Medications Ordered Prior to Encounter        Current Outpatient Medications on File Prior to Visit  Medication Sig Dispense Refill   rosuvastatin (CRESTOR) 5 MG tablet TAKE 1 TABLET BY MOUTH DAILY AT BEDTIME FOR HIGH CHOLESTEROL        No current facility-administered medications on file  prior to visit.        Family History  History reviewed. No pertinent family history.      Tobacco Use History  Social History        Tobacco Use  Smoking Status Some Days   Types: Cigarettes  Smokeless Tobacco Never        Social History  Social History         Socioeconomic History   Marital status: Single  Tobacco Use   Smoking status: Some Days      Types: Cigarettes   Smokeless tobacco: Never  Substance and Sexual Activity   Alcohol use: Yes   Drug use: Yes      Types: Marijuana    Social Drivers of Health        Housing Stability: Unknown (02/27/2024)    Housing Stability Vital Sign     Homeless in the Last Year: No        Objective:         Vitals:    02/27/24 1120  BP: 128/86  Pulse: 80  Temp: 36.1 C (97 F)  SpO2: 96%  Weight: 87.1 kg (192 lb)  Height: 170.2 cm (5\' 7" )    Body mass index is 30.07 kg/m.   Physical Exam    Constitutional:  WDWN in NAD,  conversant, no obvious deformities; lying in bed comfortably Eyes:  Pupils equal, round; sclera anicteric; moist conjunctiva; no lid lag HENT:  Oral mucosa moist; good dentition  Neck:  No masses palpated, trachea midline; no thyromegaly Lungs:  CTA bilaterally; normal respiratory effort CV:  Regular rate and rhythm; no murmurs; extremities well-perfused with no edema Abd:  +bowel sounds, soft, non-tender, no palpable organomegaly; small protruding reducible umbilical hernia with a 1.5 cm defect. Musc: Normal gait; no apparent clubbing or cyanosis in extremities Lymphatic:  No palpable cervical or axillary lymphadenopathy Skin:  Warm, dry; no sign of jaundice Psychiatric - alert and oriented x 4; calm mood and affect       Assessment and Plan:  Diagnoses and all orders for this visit:   Umbilical hernia without obstruction or gangrene     Umbilical hernia repair with mesh.  The surgical procedure has been discussed with the patient.  Potential risks, benefits, alternative  treatments, and expected outcomes have been explained.  All of the patient's questions at this time have been answered.  The likelihood of reaching the patient's treatment goal is good.  The patient understands the proposed surgical procedure and wishes to proceed.     Aris Even Delbert Harness, MD  02/27/2024 11:40 AM

## 2024-04-17 ENCOUNTER — Ambulatory Visit
Attending: Thoracic Surgery (Cardiothoracic Vascular Surgery) | Admitting: Thoracic Surgery (Cardiothoracic Vascular Surgery)

## 2024-04-17 ENCOUNTER — Encounter: Payer: Self-pay | Admitting: Thoracic Surgery (Cardiothoracic Vascular Surgery)

## 2024-04-17 VITALS — BP 142/93 | HR 72 | Resp 18 | Ht 67.0 in | Wt 196.0 lb

## 2024-04-17 DIAGNOSIS — J984 Other disorders of lung: Secondary | ICD-10-CM | POA: Diagnosis not present

## 2024-04-17 NOTE — Progress Notes (Signed)
 PCP is Aleta Anda, MD Referring Provider is Aleta Anda, MD  Chief Complaint  Patient presents with   Bronchogenic cyst    Surgical consult, Chest CT 02/20/24    HPI:HPI: Clarence Smith is sent for consultation regarding a cystic lesion found on CT.  Clarence Smith is a 63 year old man with a history of tobacco use, hypertension, dyslipidemia, anxiety, hiatal hernia, and an umbilical hernia.  He has a strong family history of coronary disease.  He had a CT for coronary calcium scoring 2 years ago.  His coronary calcium score was 0.  He was noted to have a hypodense structure around the right inferior pulmonary vein.  It measured up to 5 mm in diameter.  A CT of the chest confirmed the findings.  You recently had another CT and follow-up.  Radiology read this as fluid in the pericardial effusion surrounding the inferior pulmonary vein.  No chest pain, pressure, or tightness.  Does have some shortness of breath with exertion.  Relates that to when his weight gets up around 190.  No history of recurrent pulmonary infections.   Past Medical History:  Diagnosis Date   Allergy    seasonal allergies   Anxiety    hx of   Depression    hx of     Past Surgical History:  Procedure Laterality Date   ANKLE FRACTURE SURGERY Left     Family History  Problem Relation Age of Onset   Hypertension Mother    Colon cancer Mother 6   Colon polyps Mother    Diabetes Father    Alcohol abuse Father    Stomach cancer Father 92   Leukemia Sister    Stomach cancer Maternal Grandmother    Uterine cancer Maternal Grandmother    Esophageal cancer Neg Hx    Prostate cancer Neg Hx    Rectal cancer Neg Hx     Social History Social History   Tobacco Use   Smoking status: Every Day    Current packs/day: 0.25    Types: Cigarettes   Smokeless tobacco: Never   Tobacco comments:    3 per week  Vaping Use   Vaping status: Never Used  Substance Use Topics   Alcohol use: Yes    Alcohol/week:  4.0 standard drinks of alcohol    Types: 4 Standard drinks or equivalent per week   Drug use: Yes    Frequency: 4.0 times per week    Types: Marijuana    Comment: last used on 01/10/2021    Current Outpatient Medications  Medication Sig Dispense Refill   aspirin EC 81 MG tablet Take 81 mg by mouth daily.     erythromycin  ophthalmic ointment Place a 1/2 inch ribbon of ointment into the lower eyelid 4 times a day until your symptoms have resolved for at least 24 hours. 3.5 g 0   nicotine polacrilex (NICORETTE) 2 MG gum Weeks 1-6: chew 1 piece every 1-2 hours. Weeks 7-9: chew 1 piece every 2-4 hours. Weeks 10-12: chew 1 piece every 4-8 hours.     rosuvastatin (CRESTOR) 5 MG tablet TAKE 1 TABLET BY MOUTH DAILY AT BEDTIME FOR HIGH CHOLESTEROL     valsartan-hydrochlorothiazide (DIOVAN-HCT) 160-25 MG tablet Take 1 tablet by mouth daily.     cyclobenzaprine  (FLEXERIL ) 5 MG tablet Take 1 tablet (5 mg total) by mouth 3 (three) times daily as needed for muscle spasms. (Patient not taking: Reported on 04/17/2024) 30 tablet 1   Current Facility-Administered Medications  Medication Dose Route Frequency Provider Last Rate Last Admin   0.9 %  sodium chloride  infusion  500 mL Intravenous Once Pyrtle, Amber Bail, MD        No Known Allergies  Review of Systems  Eyes:  Positive for visual disturbance (Blurry).  Respiratory:  Positive for shortness of breath. Negative for cough and wheezing.   Cardiovascular:  Negative for chest pain, palpitations and leg swelling.  Gastrointestinal:        Umbilical hernia  Psychiatric/Behavioral:  Positive for dysphoric mood. The patient is nervous/anxious.     BP (!) 142/93   Pulse 72   Resp 18   Ht 5\' 7"  (1.702 m)   Wt 196 lb (88.9 kg)   SpO2 96%   BMI 30.70 kg/m  Physical Exam Vitals reviewed.  Constitutional:      General: He is not in acute distress.    Appearance: Normal appearance.  HENT:     Head: Normocephalic and atraumatic.  Eyes:     General: No  scleral icterus.    Extraocular Movements: Extraocular movements intact.  Cardiovascular:     Rate and Rhythm: Normal rate and regular rhythm.     Heart sounds: Normal heart sounds. No murmur heard.    No friction rub. No gallop.  Pulmonary:     Effort: Pulmonary effort is normal. No respiratory distress.     Breath sounds: Normal breath sounds. No wheezing.  Musculoskeletal:     Right lower leg: No edema.     Left lower leg: No edema.  Lymphadenopathy:     Cervical: No cervical adenopathy.  Skin:    General: Skin is warm and dry.  Neurological:     General: No focal deficit present.     Mental Status: He is alert and oriented to person, place, and time.     Cranial Nerves: No cranial nerve deficit.     Motor: No weakness.    Diagnostic Tests: CT CHEST WITHOUT CONTRAST   TECHNIQUE: Multidetector CT imaging of the chest was performed following the standard protocol without IV contrast.   RADIATION DOSE REDUCTION: This exam was performed according to the departmental dose-optimization program which includes automated exposure control, adjustment of the mA and/or kV according to patient size and/or use of iterative reconstruction technique.   COMPARISON:  Mar 24, 2022   FINDINGS: Cardiovascular: No cardiomegaly. Trace pericardial effusion. No aortic aneurysm. Similar appearance of the hypodense structure surrounding the inferior right pulmonary vein, representing a small amount of fluid within the normal pericardial sheath surrounding the inferior right pulmonary vein.   Mediastinum/Nodes: No mediastinal mass. Unchanged scattered subcentimeter multi station mediastinal lymph nodes, measuring up to 7 mm in short axis dimension, likely reactive.   Lungs/Pleura: The midline trachea and bronchi are patent. No focal airspace consolidation, pleural effusion, or pneumothorax.   Musculoskeletal: No acute fracture or destructive bone lesion. Large bridging anterior osteophyte  at T9-T10.   Upper Abdomen: No acute abnormality in the partially visualized upper abdomen. Similar appearance of multiple hepatic cysts. Small volume symmetric bilateral gynecomastia.   IMPRESSION: 1. No acute intrathoracic abnormality. Specifically, no pneumonia, pulmonary edema, or pleural effusion. 2. Similar appearance of the hypodense structure surrounding the inferior right pulmonary vein, representing a small amount of fluid within the normal pericardial sheath surrounding the inferior right pulmonary vein.     Electronically Signed   By: Rance Burrows M.D.   On: 02/21/2024 14:31 I personally reviewed the CT images including the images from  May 2023 and the images from March 2025.  There is a cystic-appearing structure adjacent to the bronchus near the takeoff of the right middle and lower lobe bronchi and then relatively close proximity to the pulmonary veins.  Indeterminate and could represent bronchogenic or pericardial cyst.  No change in size over 2 years.  Impression: Clarence Smith is a 63 year old man with a history of tobacco use, hypertension, dyslipidemia, anxiety, hiatal hernia, and an umbilical hernia.  Noted to have fluid-filled structure in the right hilum on CT coronary calcium scoring about 2 years ago.  CT angio of the chest raised the possibility of a bronchogenic cyst.  Repeat CT was done without contrast, but there is no significant size change.  Differential diagnosis includes pericardial cyst, fluid in the pericardial recess, or bronchogenic cyst.  Relatively small on the no size change would not recommend aggressive intervention at this point.  I recommended he return in 1 year with a CT chest with IV contrast to reassess the area.  Plan: Return in 1 year with CT chest with contrast  Zelphia Higashi, MD [facility] [phone]
